# Patient Record
Sex: Female | Born: 2005 | Race: Black or African American | Hispanic: No | Marital: Single | Smoking: Never smoker
Health system: Southern US, Community
[De-identification: ages and names within clinical notes are randomized; demographics above are authoritative.]

## PROBLEM LIST (undated history)

## (undated) DIAGNOSIS — F419 Anxiety disorder, unspecified: Secondary | ICD-10-CM

## (undated) DIAGNOSIS — K219 Gastro-esophageal reflux disease without esophagitis: Secondary | ICD-10-CM

## (undated) DIAGNOSIS — T7840XA Allergy, unspecified, initial encounter: Secondary | ICD-10-CM

## (undated) DIAGNOSIS — J302 Other seasonal allergic rhinitis: Secondary | ICD-10-CM

## (undated) DIAGNOSIS — J353 Hypertrophy of tonsils with hypertrophy of adenoids: Secondary | ICD-10-CM

## (undated) DIAGNOSIS — J45909 Unspecified asthma, uncomplicated: Secondary | ICD-10-CM

## (undated) DIAGNOSIS — L309 Dermatitis, unspecified: Secondary | ICD-10-CM

## (undated) HISTORY — DX: Unspecified asthma, uncomplicated: J45.909

## (undated) HISTORY — PX: TONSILLECTOMY: SUR1361

## (undated) HISTORY — DX: Anxiety disorder, unspecified: F41.9

## (undated) HISTORY — DX: Allergy, unspecified, initial encounter: T78.40XA

## (undated) HISTORY — DX: Dermatitis, unspecified: L30.9

## (undated) HISTORY — PX: ADENOIDECTOMY: SUR15

---
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
## 2024-01-01 ENCOUNTER — Ambulatory Visit (INDEPENDENT_AMBULATORY_CARE_PROVIDER_SITE_OTHER): Payer: Medicaid Other | Admitting: Otolaryngology

## 2024-01-08 ENCOUNTER — Ambulatory Visit (INDEPENDENT_AMBULATORY_CARE_PROVIDER_SITE_OTHER): Admitting: Family Medicine

## 2024-01-08 VITALS — BP 128/80 | HR 111 | Temp 98.1°F | Ht 65.04 in | Wt 240.8 lb

## 2024-01-08 DIAGNOSIS — F819 Developmental disorder of scholastic skills, unspecified: Secondary | ICD-10-CM

## 2024-01-08 DIAGNOSIS — K047 Periapical abscess without sinus: Secondary | ICD-10-CM | POA: Diagnosis not present

## 2024-01-08 DIAGNOSIS — R41840 Attention and concentration deficit: Secondary | ICD-10-CM

## 2024-01-08 NOTE — Progress Notes (Signed)
 Subjective:    Patient ID: Tina Arellano, female    DOB: 11/26/2005, 18 y.o.   MRN: [id number]  HPI Discussed the use of AI scribe software for clinical note transcription with the patient, who gave verbal consent to proceed.  History of Present Illness   The patient presents with concerns of a possible nervous disorder impacting their academic performance. They are accompanied by their mother.  They experience significant difficulties with focus and retention of information, particularly since starting college. Reading material for courses but being unable to recall the information shortly after affects their ability to complete assignments and exams, leading to poor academic performance and loss of financial aid.  Their attention span is very short, often getting distracted by other tasks or thoughts, leading to disorganization and late submission of assignments. This problem has worsened with the transition to college, both in-person and online.  They experience anxiety, particularly in social settings such as on-campus environments, exacerbating their nervousness and impacting their ability to focus. They feel 'nervous and jittery' with physical symptoms like stomach discomfort when around people.  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 They express confusion and difficulty understanding instructions and applying them practically.  They recall similar issues with focus and retention during middle and high school but did not address them at the time. They now recognize the impact on their current academic and social life.        Review of Systems     Objective:   Physical Exam General-in no acute distress Eyes-no discharge Lungs-respiratory rate normal, CTA CV-no murmurs,RRR Extremities skin warm dry no edema Neuro grossly normal Behavior  normal, alert        Assessment & Plan:   Assessment and Plan    Learning Disability Difficulty with focus, comprehension, and retention impacting academic performance. Symptoms suggest a learning disability. Psychological testing recommended for diagnosis and academic accommodations. - Request psychological testing for evaluation. - Provide letter for suspected learning disability and need for evaluation. - Complete and return questionnaires. - Refer to specialist for further evaluation. - Prepare note for school to request extended test time.  Anxiety Significant anxiety in social environments affects focus and academic tasks. Discussed potential medication use for focus and anxiety management. - Consider medication for focus and anxiety. - Schedule follow-up in 4-6 weeks to assess medication response.  Dental Issues Referred for advanced dental care, difficulty finding Medicaid-accepting providers. Suggested exploring dental schools and negotiating prices. - Consult referral department for Medicaid-accepting dental providers. - Suggest contacting [facility] [employer] for reduced-cost services. - Discuss negotiating prices with dental providers.      1. Learning disability (Primary) Patient had an evaluation by psychiatry back in 2012 we will try to get the old records to review this  She will need consultation with psychologist and may set her up for additional testing I believe she does have a learning disability that makes it difficult for her to process and take in information both printed and spoken  We will do a letter for her in support of having them grant her more time  2. Attention deficit I believe she does have ADD questionnaires given she will fill out and send them back to us  in the next few days may well need to be on medication that she would take on school days to help her function better  She also  relates that she has a tooth abscess that she will end up  needing to have a root canal with but she is on Medicaid unfortunately it is very difficult to find oral surgeons who accept Medicaid we will have our referral team look to see if they can be of assistance with this  We will go ahead with referral to psychology for further evaluation of her learning disability and ADD symptoms

## 2024-01-09 ENCOUNTER — Other Ambulatory Visit: Payer: Self-pay

## 2024-01-09 DIAGNOSIS — F819 Developmental disorder of scholastic skills, unspecified: Secondary | ICD-10-CM

## 2024-01-09 DIAGNOSIS — R41840 Attention and concentration deficit: Secondary | ICD-10-CM

## 2024-01-10 ENCOUNTER — Telehealth: Payer: Self-pay

## 2024-01-10 NOTE — Telephone Encounter (Signed)
 Dr Glendia requested form to be completed and returned on Tina Arellano for Adult ADHD this is placed in Dr Glendia folder

## 2024-01-12 ENCOUNTER — Telehealth: Payer: Self-pay | Admitting: Family Medicine

## 2024-01-12 ENCOUNTER — Other Ambulatory Visit: Payer: Self-pay | Admitting: Family Medicine

## 2024-01-12 MED ORDER — AMPHETAMINE-DEXTROAMPHET ER 5 MG PO CP24
ORAL_CAPSULE | ORAL | 0 refills | Status: DC
Start: 2024-01-12 — End: 2024-01-27

## 2024-01-12 NOTE — Telephone Encounter (Signed)
 Nurses I reviewed over the ADD information that family sent I believe the patient does suffer with some element of ADD We have previously discussed medication Therefore I will send in medication for the patient  I would not recommend starting the medication until her college classes kick in in August We will do a letter as well asking them to give her extra time for testing  It also would be wise to do a follow-up visit either with Charmaine Coy or myself please try to coordinate this to go ahead and get it on the books thank you  This follow-up visit are to be couple weeks after she starts school so we can see how well the medicine might be helping to some degree  Please let the family know the medication helps with focus and attention but does not necessarily do anything to correct the underlying learning disability

## 2024-01-12 NOTE — Progress Notes (Signed)
 Please provide family with the letter I can sign thank you

## 2024-01-12 NOTE — Telephone Encounter (Signed)
 Nurses This patient has a bad tooth that is going to need tooth extraction with a root canal-my question can referral team let us  know if there are any oral surgeons in [place] that accept Medicaid for the purpose of a root canal? Family was asking I also told the family need to talk with her dentist but apparently that was not much help

## 2024-01-12 NOTE — Telephone Encounter (Signed)
 Please see other telephone message ADD medicine was sent in Recommend a follow-up visit with myself Elveria or Charmaine after she tries the medication when school starts Also I did a letter of support for them to give her more time in school please print this letter and give this to the family thank you

## 2024-01-13 ENCOUNTER — Encounter: Payer: Self-pay | Admitting: Family Medicine

## 2024-01-13 NOTE — Telephone Encounter (Signed)
 Contacted pt' mother and has been made aware as indicated by provider

## 2024-01-13 NOTE — Telephone Encounter (Signed)
 Messed referral staff for any input on oral surgeons accepting medicaid

## 2024-01-13 NOTE — Telephone Encounter (Signed)
 Spoke with Ms Delores, pt's mom on fyi hipa form and informed per drs notes and recommendations. She verbalizes understanding. She will pick letter up at the front.

## 2024-01-14 NOTE — Telephone Encounter (Signed)
 Spoke with mom informed per referral for oral surgeon has to come from dentist, then dentist must refer to oral surgeon. States she is going to dentist today.

## 2024-01-22 DIAGNOSIS — R030 Elevated blood-pressure reading, without diagnosis of hypertension: Secondary | ICD-10-CM | POA: Diagnosis not present

## 2024-01-22 DIAGNOSIS — N390 Urinary tract infection, site not specified: Secondary | ICD-10-CM | POA: Diagnosis not present

## 2024-01-24 ENCOUNTER — Ambulatory Visit: Payer: Self-pay

## 2024-01-24 NOTE — Telephone Encounter (Signed)
 FYI Only or Action Required?: Action required by provider: clinical question for provider and update on patient condition.  Patient was last seen in primary care on 01/08/2024 by Alphonsa Glendia LABOR, MD.  Called Nurse Triage reporting Medication Reaction.  Symptoms began several days ago.  Interventions attempted: Prescription medications: Bactrim  and Rest, hydration, or home remedies.  Symptoms are: gradually worsening. Per mom, patient started Adderal XR 1 week ago, since then she has developed a UTI (treating with Bactrim ), having headaches, and now mood swings. Mother Jon expresses her concern and would like to discuss other options and/or if patient should DC the medication. Will route HP to clinic for f/u  Triage Disposition: Call PCP Now  Patient/caregiver understands and will follow disposition?: Yes Copied from CRM #[id number]. Topic: Clinical - Red Word Triage >> Jan 24, 2024  9:31 AM Avram MATSU wrote: Red Word that prompted transfer to Nurse Triage: amphetamine -dextroamphetamine (ADDERALL XR) 5 MG 24 hr capsule [[id number]] due to taking medication its causing pain in her stomach. Reason for Disposition  [1] Caller has URGENT medicine question about med that primary care doctor (or NP/PA) or specialist prescribed AND [2] triager unable to answer question  Answer Assessment - Initial Assessment Questions 1. NAME of MEDICINE: What medicine(s) are you calling about?     Adderall  2. QUESTION: What is your question? (e.g., double dose of medicine, side effect)     Mother is concerned about side effects  3. PRESCRIBER: Who prescribed the medicine? Reason: if prescribed by specialist, call should be referred to that group.    Dr. Alphonsa  4. SYMPTOMS: Do you have any symptoms? If Yes, ask: What symptoms are you having?  How bad are the symptoms (e.g., mild, moderate, severe)     Headache, UTI, and Mood swings- mild to moderate, seen at [facility] yesterday and started on Bactrim   5.  PREGNANCY:  Is there any chance that you are pregnant? When was your last menstrual period?     LMP ended last saturday  Protocols used: Medication Question Call-A-AH

## 2024-01-27 ENCOUNTER — Other Ambulatory Visit: Payer: Self-pay | Admitting: Nurse Practitioner

## 2024-01-27 MED ORDER — METHYLPHENIDATE HCL ER (OSM) 18 MG PO TBCR: 18.0000 mg | EXTENDED_RELEASE_TABLET | Freq: Every day | ORAL | 0 refills | Status: DC

## 2024-01-28 ENCOUNTER — Encounter: Payer: Self-pay | Admitting: Family Medicine

## 2024-01-30 ENCOUNTER — Encounter: Payer: Self-pay | Admitting: Family Medicine

## 2024-01-30 ENCOUNTER — Telehealth: Payer: Self-pay | Admitting: Family Medicine

## 2024-01-30 NOTE — Telephone Encounter (Signed)
 Letter dictated please see that to her dictated today but the second 1 is correct please give her the second 1 thank you

## 2024-01-30 NOTE — Progress Notes (Signed)
 Please print for patient.

## 2024-01-30 NOTE — Progress Notes (Signed)
 Corrected letter please print for patient

## 2024-02-13 ENCOUNTER — Ambulatory Visit: Admitting: Nurse Practitioner

## 2024-02-13 ENCOUNTER — Encounter: Payer: Self-pay | Admitting: Nurse Practitioner

## 2024-02-13 VITALS — BP 116/76 | HR 94 | Temp 98.1°F | Ht 65.04 in | Wt 236.0 lb

## 2024-02-13 DIAGNOSIS — L83 Acanthosis nigricans: Secondary | ICD-10-CM | POA: Diagnosis not present

## 2024-02-13 DIAGNOSIS — F902 Attention-deficit hyperactivity disorder, combined type: Secondary | ICD-10-CM

## 2024-02-13 DIAGNOSIS — E282 Polycystic ovarian syndrome: Secondary | ICD-10-CM | POA: Diagnosis not present

## 2024-02-13 DIAGNOSIS — Z0000 Encounter for general adult medical examination without abnormal findings: Secondary | ICD-10-CM

## 2024-02-13 MED ORDER — METHYLPHENIDATE HCL ER (OSM) 27 MG PO TBCR: 27.0000 mg | EXTENDED_RELEASE_TABLET | ORAL | 0 refills | Status: DC

## 2024-02-13 MED ORDER — HYDROXYZINE HCL 25 MG PO TABS: ORAL_TABLET | ORAL | 0 refills | Status: AC

## 2024-02-13 NOTE — Patient Instructions (Signed)
 Glycemic index

## 2024-02-14 ENCOUNTER — Encounter: Payer: Self-pay | Admitting: Nurse Practitioner

## 2024-02-14 NOTE — Progress Notes (Signed)
 Subjective:    Patient ID: Tina Arellano, female    DOB: [birth year], 18 y.o.   MRN: [id number]  HPI The patient comes in today for a wellness visit.  Mother present today. Defers being interviewed alone.   A review of their health history was completed.  A review of medications was also completed.  Any needed refills; Concerta   Eating habits: overall good  Regular exercise: Limited  Specialist pt sees on regular basis: Dermatology  Preventative health issues were discussed.   Additional concerns: Difficult completing tasks on current dose of Concerta . Has spells of anxiety and hyperactivity mainly at night time about the same time. Can last for hours. No difficulty once she goes to sleep. 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 Denies any history of sexual activity. Regular cycles, normal flow lasting about 5-6 days Yearly eye exams. Regular dental exams.    Review of Systems  Constitutional:  Negative for activity change, appetite change and fatigue.  HENT:  Negative for sore throat and trouble swallowing.   Respiratory:  Negative for cough, chest tightness, shortness of breath and wheezing.   Cardiovascular:  Negative for chest pain.  Gastrointestinal:  Negative for abdominal distention, abdominal pain, constipation, diarrhea, nausea and vomiting.  Genitourinary:  Negative for difficulty urinating, dysuria, frequency, genital sores, menstrual problem, pelvic pain, urgency and vaginal discharge.  Skin:        Acne vulgaris; treated by dermatology  Psychiatric/Behavioral:  Positive for decreased concentration.       02/13/2024   10:41 AM  Depression screen PHQ 2/9  Decreased Interest 0  Down, Depressed, Hopeless 0  PHQ - 2 Score 0  Altered sleeping 0  Tired, decreased energy 0  Change in appetite 0  Feeling bad or failure about yourself  0  Trouble concentrating 3  Moving slowly or fidgety/restless 0  Suicidal thoughts 0  PHQ-9 Score 3   Difficult doing work/chores Somewhat difficult      02/13/2024   10:41 AM 07/25/2023   11:40 AM 02/12/2023    9:26 AM 01/25/2023    1:18 PM  GAD 7 : Generalized Anxiety Score  Nervous, Anxious, on Edge 2 0 0 0  Control/stop worrying 0 0 0 0  Worry too much - different things 3 0 0 0  Trouble relaxing 1 0 0 0  Restless 2 0 0 0  Easily annoyed or irritable 3 0 0 0  Afraid - awful might happen 0 0 0 0  Total GAD 7 Score 11 0 0 0  Anxiety Difficulty Somewhat difficult  Not difficult at all Not difficult at all    Social History   Tobacco Use   Smoking status: Never    Passive exposure: Never   Smokeless tobacco: Never  Vaping Use   Vaping status: Never Used  Substance Use Topics   Alcohol use: No   Drug use: No        Objective:   Physical Exam Vitals and nursing note reviewed. Exam conducted with a chaperone present.  Constitutional:      General: She is not in acute distress.    Appearance: She is well-developed.  Neck:     Thyroid: No thyromegaly.     Trachea: No tracheal deviation.     Comments: Thyroid non tender to palpation. No mass or goiter noted.  Cardiovascular:     Rate and Rhythm: Normal rate and regular rhythm.     Heart sounds: Normal heart sounds. No  murmur heard. Pulmonary:     Effort: Pulmonary effort is normal.     Breath sounds: Normal breath sounds.  Chest:  Breasts:    Right: No swelling, inverted nipple, mass, skin change or tenderness.     Left: No swelling, inverted nipple, mass, skin change or tenderness.  Abdominal:     General: There is no distension.     Palpations: Abdomen is soft.     Tenderness: There is no abdominal tenderness.  Musculoskeletal:     Cervical back: Normal range of motion and neck supple.  Lymphadenopathy:     Cervical: No cervical adenopathy.     Upper Body:     Right upper body: No supraclavicular, axillary or pectoral adenopathy.     Left upper body: No supraclavicular, axillary or pectoral adenopathy.   Skin:    General: Skin is warm and dry.     Comments: Faint acanthosis nigricans noted posterior neck and axillae.   Neurological:     Mental Status: She is alert and oriented to person, place, and time.  Psychiatric:        Mood and Affect: Mood normal.        Behavior: Behavior normal.        Thought Content: Thought content normal.        Judgment: Judgment normal.    Today's Vitals   02/13/24 1032  BP: 116/76  Pulse: 94  Temp: 98.1 F (36.7 C)  SpO2: 100%  Weight: 236 lb (107 kg)  Height: 5' 5.04 (1.652 m)   Body mass index is 39.22 kg/m.         Assessment & Plan:   Problem List Items Addressed This Visit       Endocrine   PCOS (polycystic ovarian syndrome)     Musculoskeletal and Integument   Acanthosis nigricans     Other   ADHD (attention deficit hyperactivity disorder)   Morbid obesity (HCC)   Relevant Medications   methylphenidate  (CONCERTA ) 27 MG PO CR tablet   Well woman exam without gynecological exam - Primary   Meds ordered this encounter  Medications   methylphenidate  (CONCERTA ) 27 MG PO CR tablet    Sig: Take 1 tablet (27 mg total) by mouth every morning.    Dispense:  30 tablet    Refill:  0    Please note dose change    Supervising Provider:   ALPHONSA HAMILTON A [[id number]]   hydrOXYzine  (ATARAX ) 25 MG tablet    Sig: Take 1/2-1 tab po at night prn hyperactivity or anxiety; do not take with Zyrtec     Dispense:  30 tablet    Refill:  0    Supervising Provider:   ALPHONSA HAMILTON A [[id number]]   Increase Concerta  to 27 mg daily. Contact office if any problems with new dose. Hydroxyzine  at night prn hyperactivity or anxiety. Discussed PCOS and importance of healthy diet low in sugar and simple carbs; low glycemic index foods.  Encouraged activity especially with going back to school and being more sedentary. Return in about 1 year (around 02/12/2025) for physical. Recheck in 3 months for ADHD.

## 2024-02-21 ENCOUNTER — Telehealth: Payer: Self-pay | Admitting: Nurse Practitioner

## 2024-02-21 NOTE — Telephone Encounter (Signed)
 Patient 's mom Jon is checking on referral for further evaluation about her ADHD , she suppose to be referred to  psychologist. She was seen on 02/13/24.

## 2024-02-24 ENCOUNTER — Encounter: Payer: Self-pay | Admitting: Nurse Practitioner

## 2024-04-01 ENCOUNTER — Other Ambulatory Visit: Payer: Self-pay | Admitting: Nurse Practitioner

## 2024-04-02 ENCOUNTER — Telehealth: Payer: Self-pay | Admitting: Family Medicine

## 2024-04-02 NOTE — Telephone Encounter (Signed)
 A refill of her ADD medicine was sent in She needs to do a follow-up visit with Elveria or myself later this fall This is by state protocol thank you-Dr. Glendia  Please connect with patient and set up follow-up

## 2024-04-23 ENCOUNTER — Encounter (HOSPITAL_COMMUNITY): Payer: Self-pay

## 2024-04-23 ENCOUNTER — Ambulatory Visit (HOSPITAL_COMMUNITY): Admitting: Clinical

## 2024-04-23 DIAGNOSIS — F331 Major depressive disorder, recurrent, moderate: Secondary | ICD-10-CM

## 2024-04-23 DIAGNOSIS — F902 Attention-deficit hyperactivity disorder, combined type: Secondary | ICD-10-CM | POA: Diagnosis not present

## 2024-04-23 DIAGNOSIS — F419 Anxiety disorder, unspecified: Secondary | ICD-10-CM

## 2024-04-23 NOTE — Progress Notes (Signed)
 Virtual Visit via Video Note  I connected with Tina Arellano on 04/23/24 at  2:00 PM EDT by a video enabled telemedicine application and verified that I am speaking with the correct person using two identifiers.  Location: Patient: home Provider: office    I discussed the limitations of evaluation and management by telemedicine and the availability of in person appointments. The patient expressed understanding and agreed to proceed.       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 No data recorded Referral name: No data recorded Referral phone number: No data recorded  Whom do you see for routine medical problems? No data recorded Practice/Facility Name: No data recorded Practice/Facility Phone Number: No data recorded Name of Contact: No data recorded Contact Number: No data recorded Contact Fax Number: No data recorded Prescriber Name: No data recorded Prescriber Address (if known): No data recorded  What Is the Reason for Your Visit/Call Today? No data recorded How Long Has This Been Causing You Problems? No data recorded What Do You Feel Would Help You the Most Today? No data recorded  Have You Recently Been in Any Inpatient Treatment (Hospital/Detox/Crisis Center/28-Day Program)? No data recorded Name/Location of Program/Hospital:No data recorded How Long Were You There? No data recorded When Were You Discharged? No data recorded  Have You Ever Received Services From [facility] Before? No data recorded Who Do You See at [facility]? No data recorded  Have You Recently Had Any Thoughts About Hurting Yourself? No data recorded Are You Planning to Commit Suicide/Harm Yourself At This time? No  data recorded  Have you Recently Had Thoughts About Hurting Someone Tina Arellano? No data recorded Explanation: No data recorded  Have You Used Any Alcohol or Drugs in the Past 24 Hours? No data recorded How Long Ago Did You Use Drugs or Alcohol? No data recorded What Did You Use and How Much? No data recorded  Do You Currently Have a Therapist/Psychiatrist? No data recorded Name of Therapist/Psychiatrist: No data recorded  Have You Been Recently Discharged From Any Office Practice or Programs? No data recorded Explanation of Discharge From Practice/Program: No data recorded    CCA Screening Triage Referral Assessment Type of Contact: No data recorded Is this Initial or Reassessment? No data recorded Date Telepsych consult ordered in [facility]:  No data recorded Time Telepsych consult ordered in [facility]:  No data recorded  Patient Reported Information Reviewed? No data recorded Patient Left Without Being Seen? No data recorded Reason for Not Completing Assessment: No data recorded  Collateral Involvement: No data recorded  Does Patient Have a Court Appointed Legal Guardian? No data recorded Name and Contact of Legal Guardian: No data recorded If Minor and Not Living with Parent(s), Who has Custody? No data recorded Is CPS involved or ever been involved? No data recorded Is APS involved or ever been involved? No data recorded  Patient Determined To Be At Risk for Harm To Self or Others Based on Review of Patient Reported Information or Presenting Complaint? No data recorded Method: No data recorded Availability of Means: No data recorded Intent: No data recorded Notification Required: No data recorded Additional Information for Danger to Others Potential: No data recorded Additional Comments for Danger to Others Potential: No data recorded Are  There Guns or Other Weapons in Your Home? No data recorded Types of Guns/Weapons: No data recorded Are These Weapons Safely Secured?                             No data recorded Who Could Verify You Are Able To Have These Secured: No data recorded Do You Have any Outstanding Charges, Pending Court Dates, Parole/Probation? No data recorded Contacted To Inform of Risk of Harm To Self or Others: No data recorded  Location of Assessment: No data recorded  Does Patient Present under Involuntary Commitment? No data recorded IVC Papers Initial File Date: No data recorded  [place] of Residence: No data recorded  Patient Currently Receiving the Following Services: No data recorded  Determination of Need: No data recorded  Options For Referral: No data recorded    CCA Biopsychosocial Intake/Chief Complaint:  The patient was referred by her PCP Tina Arellano at [facility] with indication of difficulty with ADHD combined type and GAD for further MH treatment.  Current Symptoms/Problems: The patient notes difficulty with attention, concntration, focus, multitasking.   Patient Reported Schizophrenia/Schizoaffective Diagnosis in Past: No   Strengths: The patient notes enjoying working on 3d modeling and gaming.  Preferences: The patient notes enjoying in her free time using her eletronics and taking with friends.  Abilities: No currently involvement with sports, groups, or clubs.   Type of Services Patient Feels are Needed: Medication Management currently through PCP   for ADHD ( Methaphenydate) as well as ( Hydroxyxzine ) for Anxiety   Initial Clinical Notes/Concerns: The patient is currently receiving med therapy through her PCP for ADHD combined type and GAD. The patient has not recently been involved in counseling. No current S/I or H/I . No prior hospitalization for MH .   Mental Health Symptoms Depression:  Change in energy/activity; Difficulty Concentrating; Hopelessness; Tearfulness; Sleep (too much or little); Worthlessness; Irritability   Duration of Depressive symptoms: More than 2 weeks   Mania:  None    Anxiety:   Worrying; Tension; Sleep; Restlessness; Irritability; Difficulty concentrating   Psychosis:  None   Duration of Psychotic symptoms: NA  Trauma:  None   Obsessions:  None   Compulsions:  None   Inattention:  Avoids/dislikes activities that require focus; Fails to pay attention/makes careless mistakes; Disorganized; Forgetful; Does not follow instructions (not oppositional); Loses things; Poor follow-through on tasks; Does not seem to listen; Symptoms before age 69; Symptoms present in 2 or more settings   Hyperactivity/Impulsivity:  Always on the go; Hard time playing/leisure activities quietly; Blurts out answers; Talks excessively; Difficulty waiting turn; Runs and climbs; Feeling of restlessness; Symptoms present before age 47   Oppositional/Defiant Behaviors:  None   Emotional Irregularity:  None   Other Mood/Personality Symptoms:  Difficulty with low mood /depression symptoms   Mental Status Exam Appearance and self-care  Stature:  Average   Weight:  Overweight   Clothing:  Casual   Grooming:  Normal   Cosmetic use:  Age appropriate   Posture/gait:  Normal   Motor activity:  Not Remarkable   Sensorium  Attention:  Distractible  Concentration:  Anxiety interferes   Orientation:  X5   Recall/memory:  Defective in Recent   Affect and Mood  Affect:  Appropriate   Mood:  Depressed   Relating  Eye contact:  Normal   Facial expression:  Depressed   Attitude toward examiner:  Cooperative   Thought and  Language  Speech flow: Normal   Thought content:  Appropriate to Mood and Circumstances   Preoccupation:  None   Hallucinations:  None   Organization:  Logical  Company secretary of Knowledge:  Fair   Intelligence:  Average   Abstraction:  Normal   Judgement:  Aeronautical engineer:  Realistic   Insight:  Good   Decision Making:  Impulsive   Social Functioning  Social Maturity:  Responsible   Social Judgement:   Normal   Stress  Stressors:  School   Coping Ability:  Normal   Skill Deficits:  None   Supports:  Family; Friends/Service system     Religion: Religion/Spirituality Are You A Religious Person?: Yes What is Your Religious Affiliation?: [employer] How Might This Affect Treatment?: Prtective Factor  Leisure/Recreation: Leisure / Recreation Do You Have Hobbies?: Yes Leisure and Hobbies: 3D modeling  Exercise/Diet: Exercise/Diet Do You Exercise?: No Have You Gained or Lost A Significant Amount of Weight in the Past Six Months?: Yes-Lost Number of Pounds Lost?: 5 Do You Follow a Special Diet?: No Do You Have Any Trouble Sleeping?: Yes Explanation of Sleeping Difficulties: The patient notes difficulty with staying asleep   CCA Employment/Education Employment/Work Situation:    Education: Education Is Patient Currently Attending School?: Yes School Currently Attending: [employer] Last Grade Completed: 12 Name of High School: [employer] Did You Graduate From [employer]?: Yes Did You Attend College?: Yes What Type of College Degree Do you Have?: The patient is 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 Did You Attend Graduate School?: No What Was Your Major?: NA Did You Have Any Special Interests In School?: NA Did You Have An Individualized Education Program (IIEP): No Did You Have Any Difficulty At School?: No Patient's Education Has Been Impacted by Current Illness: No   CCA Family/Childhood History Family and Relationship History: Family history Marital status: Single Are you sexually active?: No What is your sexual orientation?: Heterosexual Has your sexual activity been affected by drugs, alcohol, medication, or emotional stress?: NA Does patient have children?: No  Childhood History:  Childhood History By whom was/is the patient raised?: Mother Description of patient's relationship with caregiver when  they were a child: Good with her Mother Patient's description of current relationship with people who raised him/her: The patient notes having a good relationship with her Mother How were you disciplined when you got in trouble as a child/adolescent?: Grounding . Does patient have siblings?: Yes Number of Siblings: 1 Description of patient's current relationship with siblings: The patient notes having a younger brother . The patient notes having a good relationship with her younger brother. Did patient suffer any verbal/emotional/physical/sexual abuse as a child?: No Did patient suffer from severe childhood neglect?: No Has patient ever been sexually abused/assaulted/raped as an adolescent or adult?: No Was the patient ever a victim of a crime or a disaster?: No Witnessed domestic violence?: No Has patient been affected by domestic violence as an adult?: No  Child/Adolescent Assessment:     CCA Substance Use Alcohol/Drug Use: Alcohol / Drug Use Pain Medications: None Prescriptions: See MAR Over the Counter: None History of alcohol / drug use?: No history of alcohol / drug abuse Longest period of sobriety (when/how long): NA                         ASAM's:  Six Dimensions of Multidimensional Assessment  Dimension 1:  Acute  Intoxication and/or Withdrawal Potential:      Dimension 2:  Biomedical Conditions and Complications:      Dimension 3:  Emotional, Behavioral, or Cognitive Conditions and Complications:     Dimension 4:  Readiness to Change:     Dimension 5:  Relapse, Continued use, or Continued Problem Potential:     Dimension 6:  Recovery/Living Environment:     ASAM Severity Score:    ASAM Recommended Level of Treatment:     Substance use Disorder (SUD)    Recommendations for Services/Supports/Treatments: Recommendations for Services/Supports/Treatments Recommendations For Services/Supports/Treatments: Individual Therapy, Medication Management  DSM5  Diagnoses: Patient Active Problem List   Diagnosis Date Noted   PCOS (polycystic ovarian syndrome) 02/13/2024   Well woman exam without gynecological exam 02/13/2024   Lactose intolerance 09/12/2023   Diffuse abdominal pain 11/27/2022   Acanthosis nigricans 01/01/2020   Morbid obesity (HCC) 01/01/2020   Menorrhagia with regular cycle 06/19/2019   Dysmenorrhea 06/19/2019   Gastroesophageal reflux disease without esophagitis 06/19/2019   Acne vulgaris 12/30/2018   Eczema 03/16/2015   ADHD (attention deficit hyperactivity disorder) 05/18/2013   Adjustment disorder 04/17/2013   Allergic rhinitis 03/23/2013   Asthma with acute exacerbation 10/16/2012    Patient Centered Plan: Patient is on the following Treatment Plan(s):  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   Patient/Guardian was advised Release of Information must be obtained prior to any record release in order to collaborate their care with an outside provider. Patient/Guardian was advised if they have not already done so to contact the registration department to sign all necessary forms in order for us  to release information regarding their care.   Consent: Patient/Guardian gives verbal consent for treatment and assignment of benefits for services provided during this visit. Patient/Guardian expressed understanding and agreed to proceed.    I discussed the assessment and treatment plan with the patient. The patient was provided an opportunity to ask questions and all were answered. The patient agreed with the plan and demonstrated an understanding  of the instructions.   The patient was advised to call back or seek an in-person evaluation if the symptoms worsen or if the condition fails to improve as anticipated.  I provided 45 minutes of non-face-to-face time during this encounter.  Jerel ONEIDA Pepper, LCSW   04/23/2024

## 2024-04-24 NOTE — Addendum Note (Signed)
 Addended by: FRANCHOT LARVE T on: 04/24/2024 11:08 AM   Modules accepted: Level of Service

## 2024-06-01 ENCOUNTER — Other Ambulatory Visit: Payer: Self-pay | Admitting: Family Medicine

## 2024-06-01 ENCOUNTER — Encounter: Payer: Self-pay | Admitting: Family Medicine

## 2024-06-18 ENCOUNTER — Ambulatory Visit (HOSPITAL_COMMUNITY): Admitting: Clinical

## 2024-06-18 DIAGNOSIS — F331 Major depressive disorder, recurrent, moderate: Secondary | ICD-10-CM | POA: Diagnosis not present

## 2024-06-18 DIAGNOSIS — F419 Anxiety disorder, unspecified: Secondary | ICD-10-CM | POA: Diagnosis not present

## 2024-06-18 DIAGNOSIS — F902 Attention-deficit hyperactivity disorder, combined type: Secondary | ICD-10-CM | POA: Diagnosis not present

## 2024-06-18 NOTE — Progress Notes (Signed)
 Virtual Visit via Video Note  I connected with Tina Arellano on 06/18/2024 at  1:00 PM EST by a video enabled telemedicine application and verified that I am speaking with the correct person using two identifiers.  Location: Patient: home Provider: office   I discussed the limitations of evaluation and management by telemedicine and the availability of in person appointments. The patient expressed understanding and agreed to proceed.  THERAPIST PROGRESS NOTE   Session Time: 1:00 PM- 1:30 PM   Participation Level: Active   Behavioral Response: CasualAlertDepressed   Type of Therapy: Individual Therapy   Treatment Goals addressed: Coping   Interventions: CBT, Motivational Interviewing, Strength-based and Supportive   Summary: Tina Arellano  is a 18 y.o. 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  The OPT therapist utilized Motivational Interviewing to assist in creating therapeutic repore.The patient in the session was engaged and work in collaboration giving feedback about her triggers and symptoms over the past few weeks. The patient overviewed her academics, interactions, and experiences over the past few weeks.The OPT therapist utilized Cognitive Behavioral Therapy through cognitive restructuring as well as worked with the patient on self awareness and implementing coping strategies to assist in management of her mental health symptoms. The OPT therapist worked with the patient reviewing basic self care areas including sleep, eating habits, hygiene, and physical exercise. The OPT therapist overviewed with the patient the importance of stabilizing her basic needs area. The patient spoke about her leisure and hobby of 3d modeling creating an avatars for gaming use . The patient spoke about her upcoming Christmas break and her planned coping strategy to manage MH symptoms during her break  from school. The patient will be taking online class medical coding class in the Spring 2026. The patient spoke about her consistency with med therapy through her PCP and verbalized this continues to help her with symptom management     Suicidal/Homicidal: Nowithout intent/plan   Therapist Response: The OPT therapist worked with the patient for the patients scheduled session. The patient was engaged in her session and gave feedback in relation to triggers, symptoms, and behavior responses over the past few weeks. The patient spoke about managing school and continuing to work towards gaining a college degree.  The OPT therapist worked with the patient utilzing in session exercise on challenging automatic negative thoughts and not creating false narratives. The OPT therapist worked with the patient  reviewing basic self care, interactions in the home and utilizing coping strategies. The OPT therapist overviewed with the patient leaning into her hobbies when possible to help balance her stress with and coping strategy. The patient spoke about her passion for 3D modeling and working on improving her skill set. The patient spoke about her plans for upcoming Christmas holidays and is currently on break for the next few weeks. The patient verbalized continuing her med therapy and consistency and effectiveness with her current medications.The OPT therapist will continue treatment work with the patient in her next scheduled session   Plan: Return again in 2/3 weeks.   Diagnosis:      Axis I:   Recurrent Moderate MDD with Anxiety / ADHD combined type                         Axis II: No diagnosis   Collaboration of Care: No additional collaboration for this session  Patient/Guardian was advised Release of Information must be obtained prior to any record release in order to collaborate their care with an outside provider. Patient/Guardian was advised if they have not already done so to contact the registration  department to sign all necessary forms in order for us  to release information regarding their care.    Consent: Patient/Guardian gives verbal consent for treatment and assignment of benefits for services provided during this visit. Patient/Guardian expressed understanding and agreed to proceed.        I discussed the assessment and treatment plan with the patient. The patient was provided an opportunity to ask questions and all were answered. The patient agreed with the plan and demonstrated an understanding of the instructions.   The patient was advised to call back or seek an in-person evaluation if the symptoms worsen or if the condition fails to improve as anticipated.   I provided 30 minutes of non-face-to-face time during this encounter.     Jerel ONEIDA Pepper, LCSW   06/18/2024

## 2024-07-03 ENCOUNTER — Ambulatory Visit: Admitting: Nurse Practitioner

## 2024-07-03 ENCOUNTER — Encounter: Payer: Self-pay | Admitting: Nurse Practitioner

## 2024-07-03 VITALS — BP 111/75 | HR 104 | Temp 97.8°F | Ht 65.05 in | Wt 250.5 lb

## 2024-07-03 DIAGNOSIS — J309 Allergic rhinitis, unspecified: Secondary | ICD-10-CM | POA: Diagnosis not present

## 2024-07-03 DIAGNOSIS — F902 Attention-deficit hyperactivity disorder, combined type: Secondary | ICD-10-CM | POA: Diagnosis not present

## 2024-07-03 MED ORDER — METHYLPHENIDATE HCL ER (OSM) 27 MG PO TBCR: 27.0000 mg | EXTENDED_RELEASE_TABLET | ORAL | 0 refills | Status: AC

## 2024-07-03 MED ORDER — OLOPATADINE HCL 0.2 % OP SOLN: 1.0000 [drp] | Freq: Every evening | OPHTHALMIC | 0 refills | Status: AC | PRN

## 2024-07-03 MED ORDER — METHYLPHENIDATE HCL ER (OSM) 27 MG PO TBCR: 27.0000 mg | EXTENDED_RELEASE_TABLET | Freq: Every morning | ORAL | 0 refills | Status: AC

## 2024-07-04 ENCOUNTER — Encounter: Payer: Self-pay | Admitting: Nurse Practitioner

## 2024-07-04 NOTE — Progress Notes (Signed)
 "  Subjective:    Patient ID: Tina Arellano, female    DOB: Dec 26, 2005, 19 y.o.   MRN: [id number]  HPI Discussed the use of AI scribe software for clinical note transcription with the patient, who gave verbal consent to proceed.  History of Present Illness Tina Arellano is a 19 year old female who presents for medication management and evaluation of eye swelling.  She has experienced weight gain over the past few months, which she attributes to the holiday season. Her physical activity level remains consistent, primarily when with friends. Her diet includes late-night eating, and she does not eat much during the day.  She is currently taking methylphenidate  27 mg extended release for ADHD, which she finds effective. She takes it around 10 AM, and it lasts until late at night without affecting her sleep. She feels sleepy when the medication wears off but denies other side effects such as headaches or rapid heart rate.  For anxiety, she has been prescribed hydroxyzine , which she rarely takes, especially when at home. Both her ADHD and anxiety medications are prescribed by the current provider.  She has a history of allergies, including a known allergy  to peanuts. Recently, she has experienced swelling and puffiness in one eye upon waking, which she suspects might be related to a new type of oatmeal she consumed. No history of tick bites or issues with beef or pork consumption. She has not been using any allergy  eye drops and does not wear contacts. Her eyes occasionally get itchy..  No chest pain, shortness of breath, coughing, or wheezing. Reports dry face after washing.     07/03/2024   10:47 AM  Depression screen PHQ 2/9  Decreased Interest 1  Down, Depressed, Hopeless 0  PHQ - 2 Score 1  Altered sleeping 1  Tired, decreased energy 2  Change in appetite 0  Feeling bad or failure about yourself  0  Trouble concentrating 0  Moving slowly or fidgety/restless 0  Suicidal thoughts 0   PHQ-9 Score 4  Difficult doing work/chores Somewhat difficult      07/03/2024   10:47 AM 04/23/2024    2:18 PM 02/13/2024   10:41 AM 07/25/2023   11:40 AM  GAD 7 : Generalized Anxiety Score  Nervous, Anxious, on Edge 0  2 0  Control/stop worrying 0  0 0  Worry too much - different things 0  3 0  Trouble relaxing 0  1 0  Restless 0  2 0  Easily annoyed or irritable 2  3 0  Afraid - awful might happen 0  0 0  Total GAD 7 Score 2  11 0  Anxiety Difficulty   Somewhat difficult      Information is confidential and restricted. Go to Review Flowsheets to unlock data.    Social History[1]      Objective:   Physical Exam NAD.  Alert, oriented.  Calm cheerful affect.  Conjunctiva clear bilaterally.  No edema or erythema noted around either eye.  No preauricular adenopathy.  No drainage.  Lungs clear.  Heart regular rate rhythm. Today's Vitals   07/03/24 1045  BP: 111/75  Pulse: (!) 104  Temp: 97.8 F (36.6 C)  SpO2: 98%  Weight: 250 lb 8 oz (113.6 kg)  Height: 5' 5.05 (1.652 m)   Body mass index is 41.62 kg/m.        Assessment & Plan:  1. Attention deficit hyperactivity disorder (ADHD), combined type (Primary) ADHD well-managed with methylphenidate  27 mg  extended release. No significant side effects except appetite suppression. - Continue methylphenidate  27 mg extended release daily. - methylphenidate  27 MG PO CR tablet; Take 1 tablet (27 mg total) by mouth every morning.  Dispense: 30 tablet; Refill: 0 - methylphenidate  27 MG PO CR tablet; Take 1 tablet (27 mg total) by mouth every morning.  Dispense: 30 tablet; Refill: 0 - methylphenidate  27 MG PO CR tablet; Take 1 tablet (27 mg total) by mouth every morning.  Dispense: 30 tablet; Refill: 0  2. Allergic rhinitis, unspecified seasonality, unspecified trigger Intermittent eye swelling, likely allergic conjunctivitis. Possible trigger from dietary changes. - Prescribed Pataday  (olopatadine ) eye drops, to be used once  daily at night. - Advised to monitor for extreme reactions and ensure EpiPen  availability. - Consider returning to previous oatmeal brand to assess for potential allergen. - Olopatadine  HCl 0.2 % SOLN; Apply 1 drop to eye at bedtime as needed. For itching or swelling  Dispense: 2.5 mL; Refill: 0  Weight gain noted, possibly due to late-night eating. Physical activity level consistent. - Encouraged healthy eating habits, focusing on portion control and meal timing. - Advised to stay active and monitor weight.  Return in about 3 months (around 10/01/2024).       [1]  Social History Tobacco Use   Smoking status: Never    Passive exposure: Never   Smokeless tobacco: Never  Vaping Use   Vaping status: Never Used  Substance Use Topics   Alcohol use: No   Drug use: No   "

## 2024-07-30 ENCOUNTER — Ambulatory Visit (HOSPITAL_COMMUNITY): Admitting: Clinical

## 2024-08-06 ENCOUNTER — Ambulatory Visit (HOSPITAL_COMMUNITY): Admitting: Clinical

## 2024-08-06 DIAGNOSIS — F331 Major depressive disorder, recurrent, moderate: Secondary | ICD-10-CM

## 2024-08-06 DIAGNOSIS — F902 Attention-deficit hyperactivity disorder, combined type: Secondary | ICD-10-CM

## 2024-08-06 NOTE — Progress Notes (Signed)
 Virtual Visit via Video Note   I connected with Tina Arellano on 08/06/2024 at  12:50 PM EST by a video enabled telemedicine application and verified that I am speaking with the correct person using two identifiers.   Location: Patient: home Provider: office   I discussed the limitations of evaluation and management by telemedicine and the availability of in person appointments. The patient expressed understanding and agreed to proceed.   THERAPIST PROGRESS NOTE   Session Time: 12:50 PM- 1:15 PM   Participation Level: Active   Behavioral Response: CasualAlertDepressed   Type of Therapy: Individual Therapy   Treatment Goals addressed: Coping   Interventions: CBT, Motivational Interviewing, Strength-based and Supportive   Summary: Tina Arellano  is a 19 y.o. 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  The OPT therapist utilized Motivational Interviewing to assist in creating therapeutic repore.The patient in the session was engaged and work in collaboration giving feedback about her triggers and symptoms over the past few weeks through the holidays and January with local inclement weather. The patient overviewed her academics, interactions, and experiences over the past few weeks.The OPT therapist utilized Cognitive Behavioral Therapy through cognitive restructuring as well as worked with the patient on self awareness and implementing coping strategies to assist in management of her mental health symptoms. The OPT therapist worked with the patient reviewing basic self care areas including sleep, eating habits, hygiene, and physical exercise. The OPT therapist overviewed with the patient the importance of stabilizing her basic needs area. The patient spoke about her leisure and hobby of 3d modeling creating an avatars for gaming use, but has most recently been gaming and doing office manager socialization.. The patient spoke about her grandmothers recent birthday and still getting out and doing walks even with the recent inclement weather.. The patient has been doing well with online classes and spoke about staying busy with the related assignments.. The patient spoke about her consistency with med therapy through her PCP and verbalized this continues to help her with symptom management.     Suicidal/Homicidal: Nowithout intent/plan   Therapist Response: The OPT therapist worked with the patient for the patients scheduled session. The patient was engaged in her session and gave feedback in relation to triggers, symptoms, and behavior responses over the past few weeks through January into February. The patient spoke about managing school and continuing to work towards gaining a college degree.  The OPT therapist worked with the patient utilzing in session exercise on challenging automatic negative thoughts and not creating false narratives. The OPT therapist worked with the patient  reviewing basic self care, interactions in the home and utilizing coping strategies. The OPT therapist overviewed with the patient leaning into her hobbies when possible to help balance her stress with and coping strategy. The patient spoke about recently for leisure making friends through virtual connect online gaming . her grandmothers recent birthday and still getting out and doing walks even with the recent inclement weather.The patient spoke about her plans for upcoming Christmas holidays and is currently on break for the next few weeks. The patient verbalized continuing her med therapy and consistency and effectiveness with her current medications.The OPT therapist overviewed with potential weather changes in February being able to utilize more external coping strategies through the end of the month and into March, while currently the patient continues to use her indoor Winter based coping.. The  OPT  therapist will continue treatment work with the patient in her next scheduled session   Plan: Return again in 2/3 weeks.   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 Patient/Guardian was advised if they have not already done so to contact the registration department to sign all necessary forms in order for us  to release information regarding their care.    Consent: Patient/Guardian gives verbal consent for treatment and assignment of benefits for services provided during this visit. Patient/Guardian expressed understanding and agreed to proceed.        I discussed the assessment and treatment plan with the patient. The patient was provided an opportunity to ask questions and all were answered. The patient agreed with the plan and demonstrated an understanding of the instructions.   The patient was advised to call back or seek an in-person evaluation if the symptoms worsen or if the condition fails to improve as anticipated.   I provided 25 minutes of non-face-to-face time during this encounter.     Jerel ONEIDA Pepper, LCSW   08/06/2024

## 2024-09-10 ENCOUNTER — Ambulatory Visit (HOSPITAL_COMMUNITY): Admitting: Clinical

## 2024-10-01 ENCOUNTER — Ambulatory Visit: Admitting: Nurse Practitioner
